# Patient Record
Sex: Male | Born: 1970 | Race: White | Hispanic: No | Marital: Single | Smoking: Never smoker
Health system: Southern US, Community
[De-identification: ages and names within clinical notes are randomized; demographics above are authoritative.]

## PROBLEM LIST (undated history)

## (undated) DIAGNOSIS — E7800 Pure hypercholesterolemia, unspecified: Secondary | ICD-10-CM

---

## 2005-07-25 ENCOUNTER — Emergency Department: Payer: Self-pay | Admitting: Emergency Medicine

## 2006-03-23 ENCOUNTER — Ambulatory Visit: Payer: Self-pay | Admitting: Internal Medicine

## 2006-04-04 ENCOUNTER — Ambulatory Visit: Payer: Self-pay | Admitting: Internal Medicine

## 2006-08-06 ENCOUNTER — Ambulatory Visit: Payer: Self-pay | Admitting: Internal Medicine

## 2006-09-04 ENCOUNTER — Ambulatory Visit: Payer: Self-pay | Admitting: Internal Medicine

## 2006-10-03 ENCOUNTER — Ambulatory Visit: Payer: Self-pay | Admitting: Internal Medicine

## 2006-10-05 ENCOUNTER — Ambulatory Visit: Payer: Self-pay | Admitting: Internal Medicine

## 2006-11-03 ENCOUNTER — Ambulatory Visit: Payer: Self-pay | Admitting: Internal Medicine

## 2015-06-28 ENCOUNTER — Encounter: Payer: Self-pay | Admitting: Emergency Medicine

## 2015-06-28 ENCOUNTER — Emergency Department
Admission: EM | Admit: 2015-06-28 | Discharge: 2015-06-28 | Disposition: A | Discharge status: Home / Self Care | Payer: Medicare Other | Attending: Emergency Medicine | Admitting: Emergency Medicine

## 2015-06-28 DIAGNOSIS — J069 Acute upper respiratory infection, unspecified: Secondary | ICD-10-CM | POA: Diagnosis not present

## 2015-06-28 DIAGNOSIS — R0981 Nasal congestion: Secondary | ICD-10-CM | POA: Diagnosis present

## 2015-06-28 MED ORDER — FLUTICASONE PROPIONATE 50 MCG/ACT NA SUSP: 1.0000 | Freq: Every day | NASAL | Status: AC

## 2015-06-28 MED ORDER — BENZONATATE 100 MG PO CAPS: 100.0000 mg | ORAL_CAPSULE | Freq: Three times a day (TID) | ORAL | Status: DC | PRN

## 2015-06-28 NOTE — Discharge Instructions (Signed)
Upper Respiratory Infection, Adult Most upper respiratory infections (URIs) are a viral infection of the air passages leading to the lungs. A URI affects the nose, throat, and upper air passages. The most common type of URI is nasopharyngitis and is typically referred to as "the common cold." URIs run their course and usually go away on their own. Most of the time, a URI does not require medical attention, but sometimes a bacterial infection in the upper airways can follow a viral infection. This is called a secondary infection. Sinus and middle ear infections are common types of secondary upper respiratory infections. Bacterial pneumonia can also complicate a URI. A URI can worsen asthma and chronic obstructive pulmonary disease (COPD). Sometimes, these complications can require emergency medical care and may be life threatening.  CAUSES Almost all URIs are caused by viruses. A virus is a type of germ and can spread from one person to another.  RISKS FACTORS You may be at risk for a URI if:   You smoke.   You have chronic heart or lung disease.  You have a weakened defense (immune) system.   You are very young or very old.   You have nasal allergies or asthma.  You work in crowded or poorly ventilated areas.  You work in health care facilities or schools. SIGNS AND SYMPTOMS  Symptoms typically develop 2-3 days after you come in contact with a cold virus. Most viral URIs last 7-10 days. However, viral URIs from the influenza virus (flu virus) can last 14-18 days and are typically more severe. Symptoms may include:   Runny or stuffy (congested) nose.   Sneezing.   Cough.   Sore throat.   Headache.   Fatigue.   Fever.   Loss of appetite.   Pain in your forehead, behind your eyes, and over your cheekbones (sinus pain).  Muscle aches.  DIAGNOSIS  Your health care provider may diagnose a URI by:  Physical exam.  Tests to check that your symptoms are not due to  another condition such as:  Strep throat.  Sinusitis.  Pneumonia.  Asthma. TREATMENT  A URI goes away on its own with time. It cannot be cured with medicines, but medicines may be prescribed or recommended to relieve symptoms. Medicines may help:  Reduce your fever.  Reduce your cough.  Relieve nasal congestion. HOME CARE INSTRUCTIONS   Take medicines only as directed by your health care provider.   Gargle warm saltwater or take cough drops to comfort your throat as directed by your health care provider.  Use a warm mist humidifier or inhale steam from a shower to increase air moisture. This may make it easier to breathe.  Drink enough fluid to keep your urine clear or pale yellow.   Eat soups and other clear broths and maintain good nutrition.   Rest as needed.   Return to work when your temperature has returned to normal or as your health care provider advises. You may need to stay home longer to avoid infecting others. You can also use a face mask and careful hand washing to prevent spread of the virus.  Increase the usage of your inhaler if you have asthma.   Do not use any tobacco products, including cigarettes, chewing tobacco, or electronic cigarettes. If you need help quitting, ask your health care provider. PREVENTION  The best way to protect yourself from getting a cold is to practice good hygiene.   Avoid oral or hand contact with people with cold  symptoms.   Wash your hands often if contact occurs.  There is no clear evidence that vitamin C, vitamin E, echinacea, or exercise reduces the chance of developing a cold. However, it is always recommended to get plenty of rest, exercise, and practice good nutrition.  SEEK MEDICAL CARE IF:   You are getting worse rather than better.   Your symptoms are not controlled by medicine.   You have chills.  You have worsening shortness of breath.  You have brown or red mucus.  You have yellow or brown nasal  discharge.  You have pain in your face, especially when you bend forward.  You have a fever.  You have swollen neck glands.  You have pain while swallowing.  You have white areas in the back of your throat. SEEK IMMEDIATE MEDICAL CARE IF:   You have severe or persistent:  Headache.  Ear pain.  Sinus pain.  Chest pain.  You have chronic lung disease and any of the following:  Wheezing.  Prolonged cough.  Coughing up blood.  A change in your usual mucus.  You have a stiff neck.  You have changes in your:  Vision.  Hearing.  Thinking.  Mood. MAKE SURE YOU:   Understand these instructions.  Will watch your condition.  Will get help right away if you are not doing well or get worse.   This information is not intended to replace advice given to you by your health care provider. Make sure you discuss any questions you have with your health care provider.   Document Released: 01/14/2001 Document Revised: 12/05/2014 Document Reviewed: 10/26/2013 Elsevier Interactive Patient Education 2016 [employer]   Take the prescription meds as directed.  Dose your daily Claritin and consider adding psuedoephedrine for sinus pressure and earache.

## 2015-06-28 NOTE — ED Notes (Signed)
C/o productive cough and congestion x 2-3 days

## 2015-06-28 NOTE — ED Notes (Signed)
None productive cough past 2-3 days. No fever. Pt reports trying Mucinex with no relief.

## 2015-06-28 NOTE — ED Provider Notes (Signed)
[facility] Emergency Department Provider Note ____________________________________________  Time seen: 57  I have reviewed the triage vital signs and the nursing notes.  HISTORY  Chief Complaint  Nasal Congestion and Cough  HPI Tommy Wood is a 44 y.o. male ports to the ED for evaluation of intermittently productive cough and congestion for the last 2-3 days. He has dosed Mucinex without relief. He denies interim fever, chills, or sweats.  History reviewed. No pertinent past medical history.  There are no active problems to display for this patient.  History reviewed. No pertinent past surgical history.  Current Outpatient Rx  Name  Route  Sig  Dispense  Refill  . benzonatate (TESSALON PERLES) 100 MG capsule   Oral   Take 1 capsule (100 mg total) by mouth 3 (three) times daily as needed for cough (Take 1-2 per dose).   30 capsule   0   . fluticasone (FLONASE) 50 MCG/ACT nasal spray   Each Nare   Place 1 spray into both nostrils daily.   16 g   0    Allergies Doxycycline  No family history on file.  Social History Social History  Substance Use Topics  . Smoking status: Never Smoker   . Smokeless tobacco: None  . Alcohol Use: No   Review of Systems  Constitutional: Negative for fever. Eyes: Negative for visual changes. ENT: Negative for sore throat. Cardiovascular: Negative for chest pain. Respiratory: Negative for shortness of breath. Gastrointestinal: Negative for abdominal pain, vomiting and diarrhea. Genitourinary: Negative for dysuria. Musculoskeletal: Negative for back pain. Skin: Negative for rash. Neurological: Negative for headaches, focal weakness or numbness. ____________________________________________  PHYSICAL EXAM:  VITAL SIGNS: ED Triage Vitals  Enc Vitals Group     BP 06/28/15 0823 99/68 mmHg     Pulse Rate 06/28/15 0819 107     Resp 06/28/15 0819 18     Temp 06/28/15 0819 97.9 F (36.6 C)     Temp  Source 06/28/15 0819 Oral     SpO2 06/28/15 0819 100 %     Weight 06/28/15 0819 80 lb (36.288 kg)     Height 06/28/15 0819  (1.499 m)     Head Cir --      Peak Flow --      Pain Score 06/28/15 0821 0     Pain Loc --      Pain Edu? --      Excl. in GC? --    Constitutional: Alert and oriented. Well appearing and in no distress. Head: Normocephalic and atraumatic.      Eyes: Conjunctivae are normal. PERRL. Normal extraocular movements      Ears: Canals clear. TMs intact bilaterally, retracted.   Nose: No congestion/rhinorrhea.   Mouth/Throat: Mucous membranes are moist.   Neck: Supple. No thyromegaly. Hematological/Lymphatic/Immunological: No cervical lymphadenopathy. Cardiovascular: Normal rate, regular rhythm.  Respiratory: Normal respiratory effort. No wheezes/rales/rhonchi. Gastrointestinal: Soft and nontender. No distention. Musculoskeletal: Nontender with normal range of motion in all extremities.  Neurologic:  Normal gait without ataxia. Normal speech and language. No gross focal neurologic deficits are appreciated. Skin:  Skin is warm, dry and intact. No rash noted. Psychiatric: Mood and affect are normal. Patient exhibits appropriate insight and judgment. ____________________________________________  INITIAL IMPRESSION / ASSESSMENT AND PLAN / ED COURSE  Symptoms consistent with an acute URI and mild bronchospasm. Patient will be discharged with prescriptions for [facility] and Flonase. Continue to dose his over-the-counter nasal spray, and is encouraged to discontinue  Mucinex at this time. He will instead dosing over-the-counter allergy medicine to reverse his rhinitis. He will follow with his primary care provider for ongoing symptoms. ____________________________________________  FINAL CLINICAL IMPRESSION(S) / ED DIAGNOSES  Final diagnoses:  Acute URI      Lissa HoardJenise V Bacon Jerzey Komperda, PA-C 06/30/15 1409  Sharman CheekPhillip Stafford, MD 06/30/15 2143

## 2018-08-07 ENCOUNTER — Encounter: Payer: Self-pay | Admitting: Emergency Medicine

## 2018-08-07 ENCOUNTER — Emergency Department
Admission: EM | Admit: 2018-08-07 | Discharge: 2018-08-07 | Disposition: A | Discharge status: Home / Self Care | Payer: Medicare Other | Attending: Emergency Medicine | Admitting: Emergency Medicine

## 2018-08-07 ENCOUNTER — Other Ambulatory Visit: Payer: Self-pay

## 2018-08-07 ENCOUNTER — Emergency Department: Payer: Medicare Other

## 2018-08-07 DIAGNOSIS — R112 Nausea with vomiting, unspecified: Secondary | ICD-10-CM | POA: Insufficient documentation

## 2018-08-07 DIAGNOSIS — J069 Acute upper respiratory infection, unspecified: Secondary | ICD-10-CM | POA: Diagnosis not present

## 2018-08-07 DIAGNOSIS — Z79899 Other long term (current) drug therapy: Secondary | ICD-10-CM | POA: Insufficient documentation

## 2018-08-07 DIAGNOSIS — R05 Cough: Secondary | ICD-10-CM | POA: Diagnosis present

## 2018-08-07 DIAGNOSIS — R1110 Vomiting, unspecified: Secondary | ICD-10-CM

## 2018-08-07 HISTORY — DX: Pure hypercholesterolemia, unspecified: E78.00

## 2018-08-07 LAB — CBC
HCT: 36.3 % — ABNORMAL LOW (ref 39.0–52.0)
HEMOGLOBIN: 12.2 g/dL — AB (ref 13.0–17.0)
MCH: 30.7 pg (ref 26.0–34.0)
MCHC: 33.6 g/dL (ref 30.0–36.0)
MCV: 91.2 fL (ref 80.0–100.0)
PLATELETS: 331 10*3/uL (ref 150–400)
RBC: 3.98 MIL/uL — AB (ref 4.22–5.81)
RDW: 13.5 % (ref 11.5–15.5)
WBC: 6.8 10*3/uL (ref 4.0–10.5)
nRBC: 0 % (ref 0.0–0.2)

## 2018-08-07 LAB — URINALYSIS, COMPLETE (UACMP) WITH MICROSCOPIC
BACTERIA UA: NONE SEEN
Bilirubin Urine: NEGATIVE
Glucose, UA: NEGATIVE mg/dL
KETONES UR: 5 mg/dL — AB
Leukocytes, UA: NEGATIVE
NITRITE: NEGATIVE
PROTEIN: NEGATIVE mg/dL
Specific Gravity, Urine: 1.024 (ref 1.005–1.030)
pH: 5 (ref 5.0–8.0)

## 2018-08-07 LAB — COMPREHENSIVE METABOLIC PANEL
ALBUMIN: 5 g/dL (ref 3.5–5.0)
ALK PHOS: 58 U/L (ref 38–126)
ALT: 22 U/L (ref 0–44)
AST: 43 U/L — ABNORMAL HIGH (ref 15–41)
Anion gap: 10 (ref 5–15)
BUN: 12 mg/dL (ref 6–20)
CALCIUM: 9.4 mg/dL (ref 8.9–10.3)
CHLORIDE: 100 mmol/L (ref 98–111)
CO2: 23 mmol/L (ref 22–32)
Creatinine, Ser: 0.61 mg/dL (ref 0.61–1.24)
GFR calc non Af Amer: >60 mL/min (ref >60)
GLUCOSE: 107 mg/dL — AB (ref 70–99)
Potassium: 3.7 mmol/L (ref 3.5–5.1)
SODIUM: 133 mmol/L — AB (ref 135–145)
TOTAL PROTEIN: 8.1 g/dL (ref 6.5–8.1)
Total Bilirubin: 1 mg/dL (ref 0.3–1.2)

## 2018-08-07 LAB — LIPASE, BLOOD: LIPASE: 26 U/L (ref 11–51)

## 2018-08-07 LAB — INFLUENZA PANEL BY PCR (TYPE A & B)
Influenza A By PCR: NEGATIVE
Influenza B By PCR: NEGATIVE

## 2018-08-07 MED ORDER — ONDANSETRON 4 MG PO TBDP: 4.0000 mg | ORAL_TABLET | Freq: Three times a day (TID) | ORAL | 0 refills | Status: AC | PRN

## 2018-08-07 MED ORDER — SODIUM CHLORIDE 0.9 % IV BOLUS: 1000.0000 mL | Freq: Once | INTRAVENOUS | Status: DC

## 2018-08-07 MED ORDER — ONDANSETRON 4 MG PO TBDP
4.0000 mg | ORAL_TABLET | Freq: Once | ORAL | Status: AC | PRN
  Administered 2018-08-07: 4 mg via ORAL
  Filled 2018-08-07: qty 1

## 2018-08-07 MED ORDER — ONDANSETRON HCL 4 MG/2ML IJ SOLN
4.0000 mg | Freq: Once | INTRAMUSCULAR | Status: DC
  Filled 2018-08-07: qty 2

## 2018-08-07 MED ORDER — ACETAMINOPHEN 500 MG PO TABS
1000.0000 mg | ORAL_TABLET | Freq: Once | ORAL | Status: AC
  Administered 2018-08-07: 1000 mg via ORAL
  Filled 2018-08-07: qty 2

## 2018-08-07 NOTE — ED Provider Notes (Signed)
[facility] Emergency Department Provider Note  Time seen: 1:10 PM  I have reviewed the triage vital signs and the nursing notes.   HISTORY  Chief Complaint Cough; Emesis; and Headache    HPI Tommy Wood is a 48 y.o. male with a past medical history of hyperlipidemia presents to the emergency department for cough congestion headache.  According to the patient family was over last week with similar symptoms, now the patient has been coughing with congestion the headache over the past 2 days.  Patient started throwing up yesterday and continued to throw up all night and today.  Denies any known fever.  No abdominal pain.  No chest pain.  States cough but no sputum production.    Past Medical History:  Diagnosis Date  . High cholesterol     There are no active problems to display for this patient.   History reviewed. No pertinent surgical history.  Prior to Admission medications   Medication Sig Start Date End Date Taking? Authorizing Provider  benzonatate (TESSALON PERLES) 100 MG capsule Take 1 capsule (100 mg total) by mouth 3 (three) times daily as needed for cough (Take 1-2 per dose). 06/28/15   Menshew, Charlesetta IvoryJenise V Bacon, PA-C  fluticasone (FLONASE) 50 MCG/ACT nasal spray Place 1 spray into both nostrils daily. 06/28/15   Menshew, Charlesetta IvoryJenise V Bacon, PA-C    Allergies  Allergen Reactions  . Doxycycline Nausea And Vomiting    No family history on file.  Social History Social History   Tobacco Use  . Smoking status: Never Smoker  Substance Use Topics  . Alcohol use: No  . Drug use: Not on file    Review of Systems Constitutional: Negative for fever. ENT: Positive for congestion Cardiovascular: Negative for chest pain. Respiratory: Negative for shortness of breath. Gastrointestinal: Negative for abdominal pain.  Positive for nausea vomiting.  Negative for diarrhea Genitourinary: Negative for urinary compaints Musculoskeletal: Negative for  musculoskeletal complaints Skin: Negative for skin complaints  Neurological: Moderate headache All other ROS negative  ____________________________________________   PHYSICAL EXAM:  VITAL SIGNS: ED Triage Vitals  Enc Vitals Group     BP 08/07/18 1038 99/67     Pulse Rate 08/07/18 1038 85     Resp 08/07/18 1038 18     Temp 08/07/18 1038 97.6 F (36.4 C)     Temp Source 08/07/18 1038 Oral     SpO2 08/07/18 1038 97 %     Weight 08/07/18 1040 90 lb (40.8 kg)     Height 08/07/18 1040 5\' 2"  (1.575 m)     Head Circumference --      Peak Flow --      Pain Score 08/07/18 1039 6     Pain Loc --      Pain Edu? --      Excl. in GC? --    Constitutional: Alert and oriented. Well appearing and in no distress. Eyes: Normal exam ENT   Head: Normocephalic and atraumatic   Mouth/Throat: Mucous membranes are moist. Cardiovascular: Normal rate, regular rhythm Respiratory: Normal respiratory effort without tachypnea nor retractions. Breath sounds are clear Gastrointestinal: Soft and nontender. No distention.   Musculoskeletal: Nontender with normal range of motion in all extremities. No lower extremity tenderness or edema. Neurologic:  Normal speech and language. No gross focal neurologic deficits  Skin:  Skin is warm, dry and intact.  Psychiatric: Mood and affect are normal.      RADIOLOGY  Chest x-ray negative  ____________________________________________  INITIAL IMPRESSION / ASSESSMENT AND PLAN / ED COURSE  Pertinent labs & imaging results that were available during my care of the patient were reviewed by me and considered in my medical decision making (see chart for details).  Patient presents to the emergency department for cough and congestion as well as headache.  Patient states nausea vomiting since yesterday and all night.  We will check labs, influenza, chest x-ray and continue to closely monitor.  Overall the patient appears very well, no distress.  Chest x-ray  is negative.  Labs are largely within normal limits.  Influenza is negative.  Patient states he is feeling much better.  Headache is nearly gone.  Patient receiving IV fluids, and Zofran.  Will discharge from the emergency department with PCP follow-up.  We will discharge with nausea medication to be used if needed.  Continue supportive care at home.  Patient agreeable to plan of care.  ____________________________________________   FINAL CLINICAL IMPRESSION(S) / ED DIAGNOSES  Nausea vomiting Cough congestion   Minna Antis, MD 08/07/18 1440

## 2018-08-07 NOTE — ED Triage Notes (Signed)
PT arrived with concerns over headache, dry cough, and emesis that started 'a couple of days ago." Pt reports "I threw up all night long."

## 2020-10-25 IMAGING — CR DG CHEST 2V
1 series · 2 of 2 positions shown · non-contrast
Comparison: 11/24/01

CLINICAL DATA: Cough.

EXAM:
CHEST - 2 VIEW

[Series 1: dg chest 2 view · 0.14mm/px · 2 of 2 slices shown]
[im 1/2]
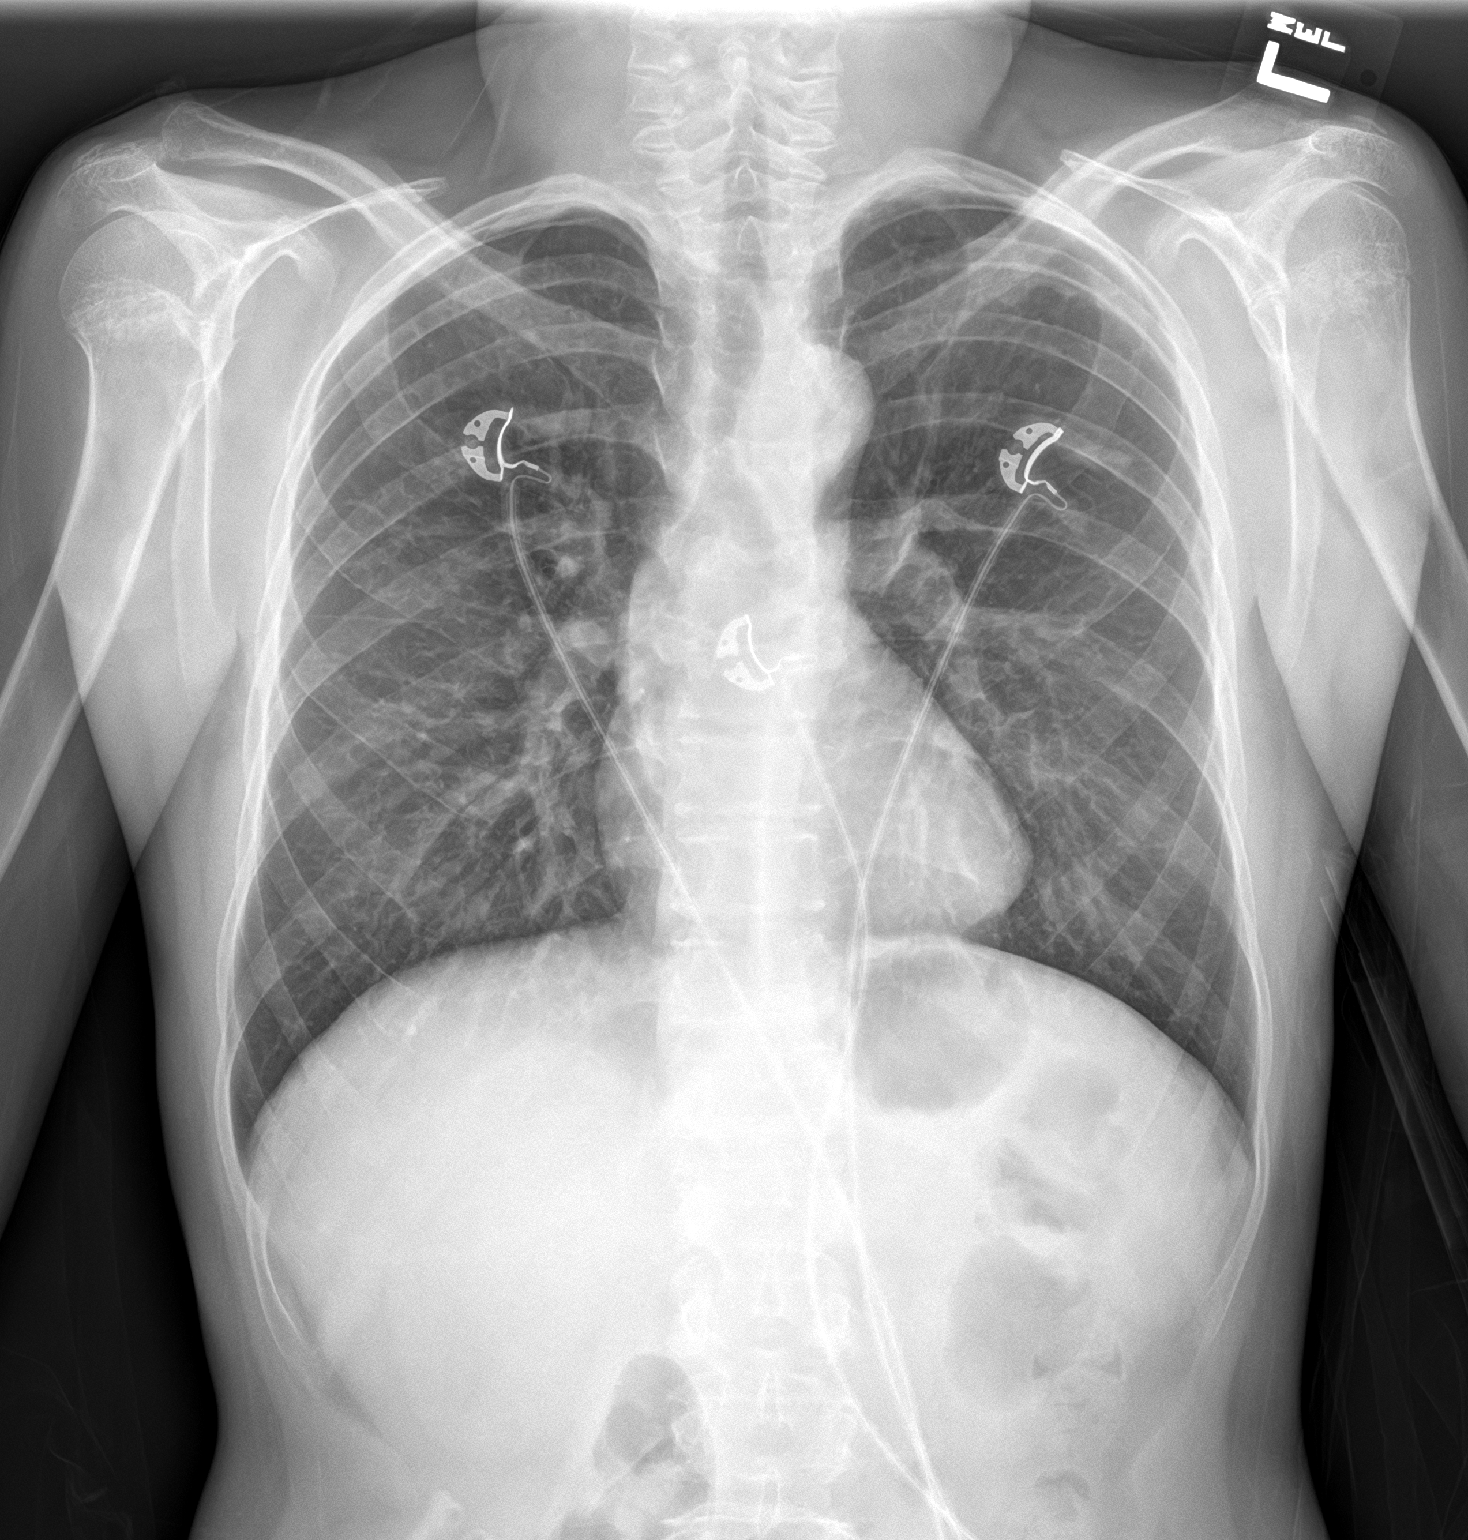
[im 2/2]
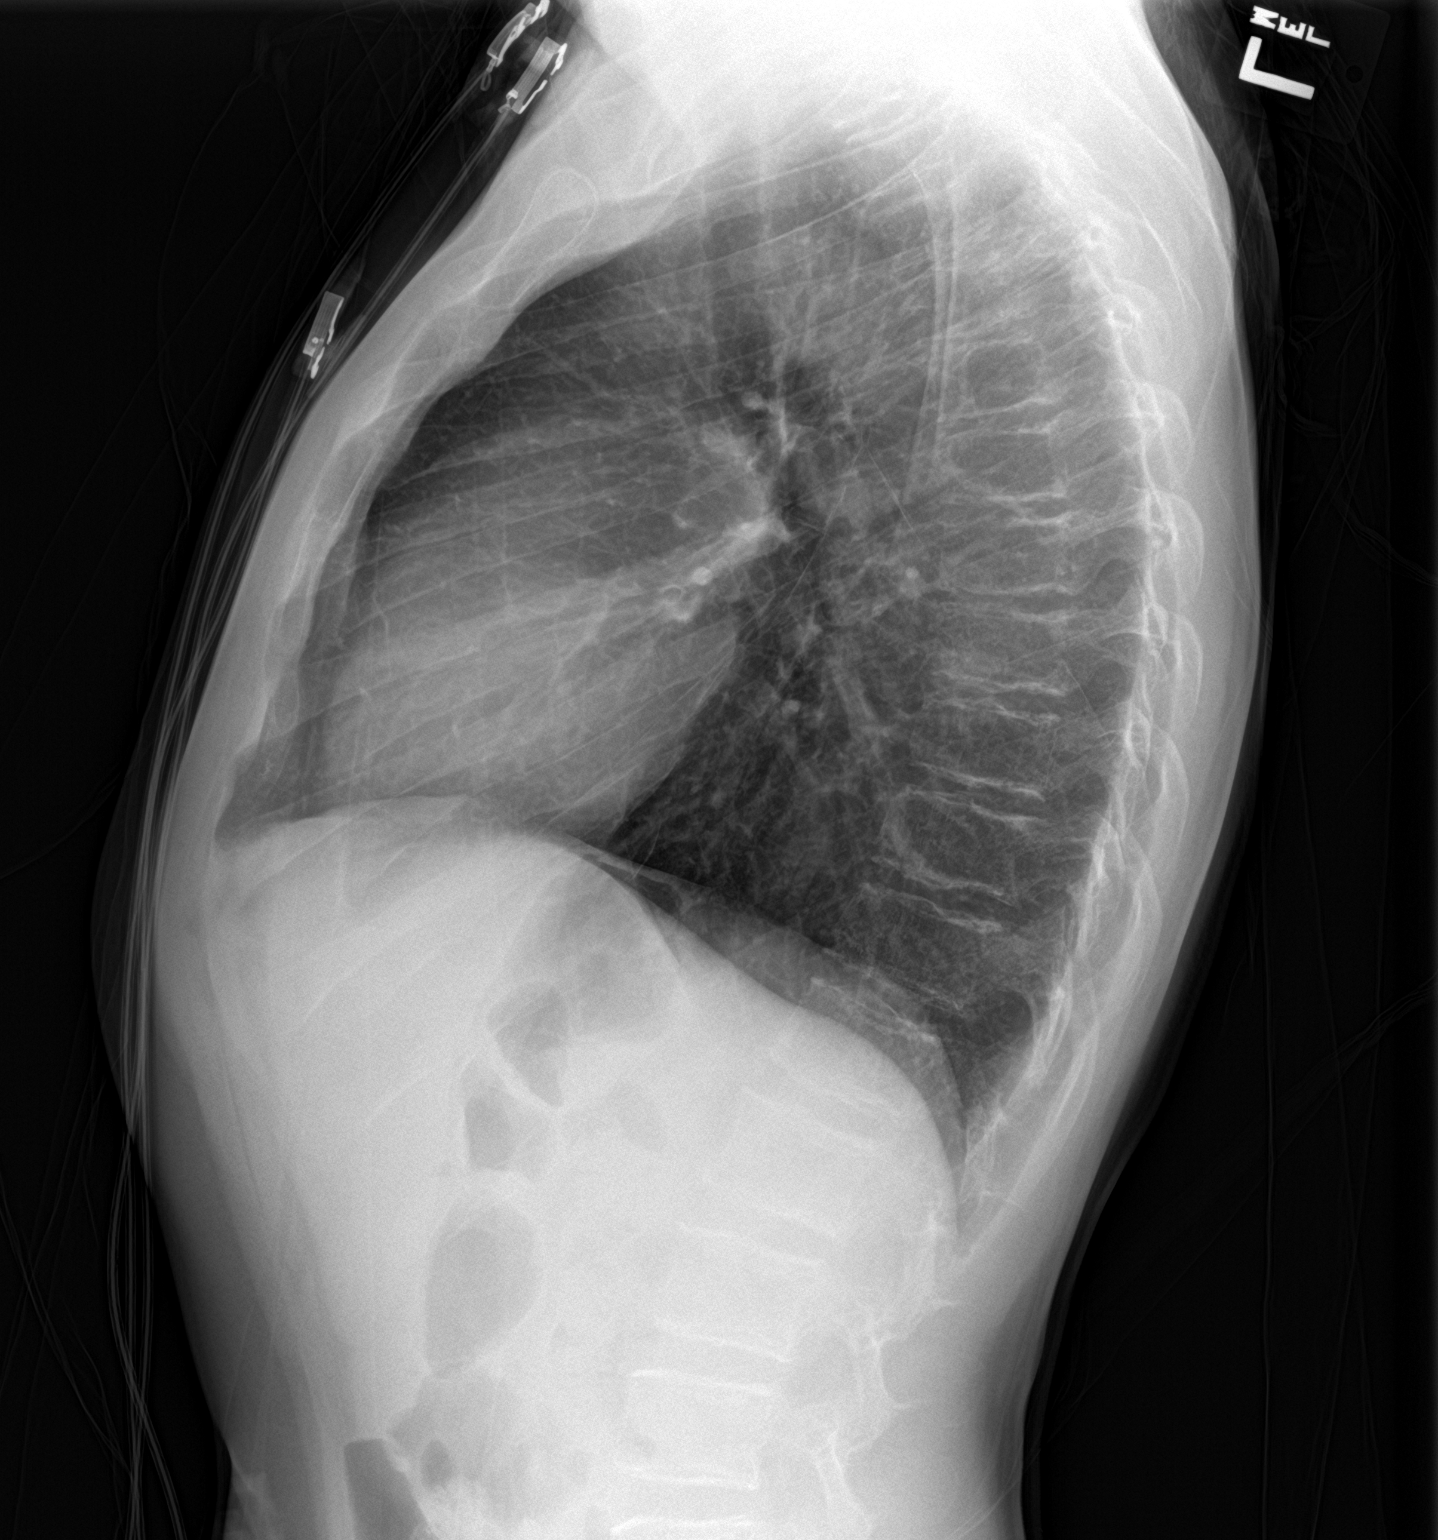

[2 of 2 positions shown; findings below may reference images not displayed]

FINDINGS: The heart size and mediastinal contours are within normal limits.
Both lungs are clear. The visualized skeletal structures are
unremarkable.
IMPRESSION: No active cardiopulmonary disease.

## 2022-03-24 ENCOUNTER — Other Ambulatory Visit: Payer: Self-pay

## 2022-03-24 ENCOUNTER — Encounter: Payer: Self-pay | Admitting: Emergency Medicine

## 2022-03-24 ENCOUNTER — Ambulatory Visit
Admission: EM | Admit: 2022-03-24 | Discharge: 2022-03-24 | Disposition: A | Discharge status: Home / Self Care | Payer: Medicare Other

## 2022-03-24 DIAGNOSIS — U071 COVID-19: Secondary | ICD-10-CM | POA: Diagnosis not present

## 2022-03-24 MED ORDER — PROMETHAZINE-DM 6.25-15 MG/5ML PO SYRP: 5.0000 mL | ORAL_SOLUTION | Freq: Four times a day (QID) | ORAL | 0 refills | Status: DC | PRN

## 2022-03-24 MED ORDER — IPRATROPIUM BROMIDE 0.06 % NA SOLN
2.0000 | Freq: Four times a day (QID) | NASAL | 12 refills | Status: AC
Start: 2022-03-24 — End: ?

## 2022-03-24 MED ORDER — BENZONATATE 100 MG PO CAPS: 200.0000 mg | ORAL_CAPSULE | Freq: Three times a day (TID) | ORAL | 0 refills | Status: AC

## 2022-03-24 NOTE — ED Provider Notes (Signed)
[facility]    CSN: [id number] Arrival date & time: 03/24/22  0841      History   Chief Complaint Chief Complaint  Patient presents with   Cough    HPI Tommy Wood is a 51 y.o. male.   HPI  51 year old male here for evaluation of respiratory complaints.  Patient reports that symptoms began 7 days ago and they consist of fatigue, postnasal drip, nasal congestion, nonproductive cough, and an upset stomach.  He denies any fever, ear pain, sore throat, vomiting, diarrhea, shortness of breath, or wheezing.  He has taken a home COVID test which was positive.  Past Medical History:  Diagnosis Date   High cholesterol     There are no problems to display for this patient.   History reviewed. No pertinent surgical history.     Home Medications    Prior to Admission medications   Medication Sig Start Date End Date Taking? Authorizing Provider  benzonatate (TESSALON) 100 MG capsule Take 2 capsules (200 mg total) by mouth every 8 (eight) hours. 03/24/22  Yes Becky Augusta, NP  ipratropium (ATROVENT) 0.06 % nasal spray Place 2 sprays into both nostrils 4 (four) times daily. 03/24/22  Yes Becky Augusta, NP  promethazine-dextromethorphan (PROMETHAZINE-DM) 6.25-15 MG/5ML syrup Take 5 mLs by mouth 4 (four) times daily as needed. 03/24/22  Yes Becky Augusta, NP  fenofibrate (TRICOR) 145 MG tablet Take 145 mg by mouth daily. 01/06/22   [provider]  fluticasone (FLONASE) 50 MCG/ACT nasal spray Place 1 spray into both nostrils daily. Patient not taking: Reported on 03/24/2022 06/28/15   Menshew, Charlesetta Ivory, PA-C  ondansetron (ZOFRAN ODT) 4 MG disintegrating tablet Take 1 tablet (4 mg total) by mouth every 8 (eight) hours as needed for nausea or vomiting. Patient not taking: Reported on 03/24/2022 08/07/18   Minna Antis, MD    Family History History reviewed. No pertinent family history.  Social History Social History   Tobacco Use   Smoking status: Never   Vaping Use   Vaping Use: Never used  Substance Use Topics   Alcohol use: No   Drug use: Never     Allergies   Doxycycline   Review of Systems Review of Systems  Constitutional:  Positive for fatigue. Negative for fever.  HENT:  Positive for congestion and postnasal drip. Negative for ear pain, rhinorrhea and sore throat.   Respiratory:  Positive for cough. Negative for shortness of breath and wheezing.   Gastrointestinal:  Positive for nausea. Negative for diarrhea and vomiting.  Skin:  Negative for rash.  Hematological: Negative.   Psychiatric/Behavioral: Negative.       Physical Exam Triage Vital Signs ED Triage Vitals [03/24/22 0903]  Enc Vitals Group     BP      Pulse      Resp      Temp      Temp src      SpO2      Weight      Height      Head Circumference      Peak Flow      Pain Score 4     Pain Loc      Pain Edu?      Excl. in GC?    No data found.  Updated Vital Signs BP 122/80 (BP Location: Left Arm) Comment (BP Location): small cuff  Pulse 68   Temp 97.6 F (36.4 C) (Oral)   Resp 18   SpO2 100%  Visual Acuity Right Eye Distance:   Left Eye Distance:   Bilateral Distance:    Right Eye Near:   Left Eye Near:    Bilateral Near:     Physical Exam Vitals and nursing note reviewed.  Constitutional:      Appearance: Normal appearance. He is not ill-appearing.  HENT:     Head: Normocephalic and atraumatic.     Right Ear: Tympanic membrane, ear canal and external ear normal. There is no impacted cerumen.     Left Ear: Tympanic membrane, ear canal and external ear normal. There is no impacted cerumen.     Nose: Congestion and rhinorrhea present.     Mouth/Throat:     Mouth: Mucous membranes are moist.     Pharynx: Oropharynx is clear. No oropharyngeal exudate or posterior oropharyngeal erythema.  Cardiovascular:     Rate and Rhythm: Normal rate and regular rhythm.     Pulses: Normal pulses.     Heart sounds: Normal heart sounds. No  murmur heard.    No friction rub. No gallop.  Pulmonary:     Effort: Pulmonary effort is normal.     Breath sounds: Normal breath sounds. No wheezing, rhonchi or rales.  Musculoskeletal:     Cervical back: Normal range of motion and neck supple.  Lymphadenopathy:     Cervical: No cervical adenopathy.  Skin:    General: Skin is warm and dry.     Capillary Refill: Capillary refill takes less than 2 seconds.     Findings: No erythema or rash.  Neurological:     General: No focal deficit present.     Mental Status: He is alert and oriented to person, place, and time.  Psychiatric:        Mood and Affect: Mood normal.        Behavior: Behavior normal.        Thought Content: Thought content normal.        Judgment: Judgment normal.      UC Treatments / Results  Labs (all labs ordered are listed, but only abnormal results are displayed) Labs Reviewed - No data to display  EKG   Radiology No results found.  Procedures Procedures (including critical care time)  Medications Ordered in UC Medications - No data to display  Initial Impression / Assessment and Plan / UC Course  I have reviewed the triage vital signs and the nursing notes.  Pertinent labs & imaging results that were available during my care of the patient were reviewed by me and considered in my medical decision making (see chart for details).   Patient is a very pleasant, nontoxic-appearing 51 year old male here for evaluation of respiratory complaints as outlined in HPI above.  Patient has taken a home COVID test and he is positive.  He has had symptoms for the last 7 days and he is in no respiratory distress.  He is able to speak in full sentences without any dyspnea or tachypnea.  Physical exam reveals pearly-gray tympanic membranes bilaterally with normal light reflex and clear external auditory canals.  Nasal mucosa is mildly edematous and erythematous with clear discharge in both nares.  Oropharyngeal exam  reveals clear postnasal drip but no erythema or injection of the posterior oropharynx.  No cervical lymphadenopathy appreciable exam.  Cardiopulmonary exam reveals S1-S2 heart sounds with regular rate and rhythm and lung sounds are clear auscultation all fields.  Skin is free of rashes.  I will treat patient for COVID-19 but he is  outside the window for isolation or antiviral therapy.  Only significant risk factor is high cholesterol.  I have cautioned him that if he develops worsening respiratory symptoms that he needs to go to the ER.  I will treat his symptoms with Atrovent nasal spray, Tessalon Perles, and Promethazine DM cough syrup.   Final Clinical Impressions(s) / UC Diagnoses   Final diagnoses:  COVID-19     Discharge Instructions      Your home test for COVID was positive which indicated that you have COVID.  You are beyond the infectious period and need for quarantine as you have had symptoms for 7 days. You do need to wear a mask around others for an additional 3 days.  For your respiratory symptoms please use the following:  Use the Atrovent nasal spray, 2 squirts in each nostril every 6 hours, as needed for runny nose and postnasal drip.  Use the Tessalon Perles every 8 hours during the day.  Take them with a small sip of water.  They may give you some numbness to the base of your tongue or a metallic taste in your mouth, this is normal.  Use the Promethazine DM cough syrup at bedtime for cough and congestion.  It will make you drowsy so do not take it during the day.  Return for reevaluation or see your primary care provider for any new or worsening symptoms. If you develop shortness of breath, especially at rest, cannont catch your breath, or cannot speak in full sentences you need to go to the ER.     ED Prescriptions     Medication Sig Dispense Auth. Provider   benzonatate (TESSALON) 100 MG capsule Take 2 capsules (200 mg total) by mouth every 8 (eight) hours. 21  capsule Becky Augusta, NP   ipratropium (ATROVENT) 0.06 % nasal spray Place 2 sprays into both nostrils 4 (four) times daily. 15 mL Becky Augusta, NP   promethazine-dextromethorphan (PROMETHAZINE-DM) 6.25-15 MG/5ML syrup Take 5 mLs by mouth 4 (four) times daily as needed. 118 mL Becky Augusta, NP      PDMP not reviewed this encounter.   Becky Augusta, NP 03/24/22 [phone]

## 2022-03-24 NOTE — ED Triage Notes (Signed)
Fatigue for 7 days.  Cough, sinus drainage, and stomach upset.  No vomiting or diarrhea.  Covid test was positive yesterday  8/14-symptoms onset 8/20- tested positive for covid at home

## 2022-03-24 NOTE — Discharge Instructions (Signed)
Your home test for COVID was positive which indicated that you have COVID.  You are beyond the infectious period and need for quarantine as you have had symptoms for 7 days. You do need to wear a mask around others for an additional 3 days.  For your respiratory symptoms please use the following:  Use the Atrovent nasal spray, 2 squirts in each nostril every 6 hours, as needed for runny nose and postnasal drip.  Use the Tessalon Perles every 8 hours during the day.  Take them with a small sip of water.  They may give you some numbness to the base of your tongue or a metallic taste in your mouth, this is normal.  Use the Promethazine DM cough syrup at bedtime for cough and congestion.  It will make you drowsy so do not take it during the day.  Return for reevaluation or see your primary care provider for any new or worsening symptoms. If you develop shortness of breath, especially at rest, cannont catch your breath, or cannot speak in full sentences you need to go to the ER.

## 2022-07-19 ENCOUNTER — Ambulatory Visit
Admission: EM | Admit: 2022-07-19 | Discharge: 2022-07-19 | Disposition: A | Discharge status: Home / Self Care | Payer: 59 | Attending: Family Medicine | Admitting: Family Medicine

## 2022-07-19 DIAGNOSIS — Z1152 Encounter for screening for COVID-19: Secondary | ICD-10-CM | POA: Diagnosis not present

## 2022-07-19 DIAGNOSIS — J020 Streptococcal pharyngitis: Secondary | ICD-10-CM | POA: Insufficient documentation

## 2022-07-19 DIAGNOSIS — J101 Influenza due to other identified influenza virus with other respiratory manifestations: Secondary | ICD-10-CM | POA: Diagnosis not present

## 2022-07-19 LAB — RESP PANEL BY RT-PCR (RSV, FLU A&B, COVID)  RVPGX2
Influenza A by PCR: POSITIVE — AB
Influenza B by PCR: NEGATIVE
Resp Syncytial Virus by PCR: NEGATIVE
SARS Coronavirus 2 by RT PCR: NEGATIVE

## 2022-07-19 LAB — GROUP A STREP BY PCR: Group A Strep by PCR: DETECTED — AB

## 2022-07-19 MED ORDER — ONDANSETRON HCL 4 MG PO TABS: 4.0000 mg | ORAL_TABLET | Freq: Four times a day (QID) | ORAL | 0 refills | Status: AC

## 2022-07-19 MED ORDER — PROMETHAZINE-DM 6.25-15 MG/5ML PO SYRP: 5.0000 mL | ORAL_SOLUTION | Freq: Four times a day (QID) | ORAL | 0 refills | Status: AC | PRN

## 2022-07-19 MED ORDER — AMOXICILLIN 500 MG PO TABS: 1000.0000 mg | ORAL_TABLET | Freq: Every day | ORAL | 0 refills | Status: AC

## 2022-07-19 MED ORDER — OSELTAMIVIR PHOSPHATE 30 MG PO CAPS: 60.0000 mg | ORAL_CAPSULE | Freq: Two times a day (BID) | ORAL | 0 refills | Status: AC

## 2022-07-19 MED ORDER — IBUPROFEN 100 MG/5ML PO SUSP
400.0000 mg | Freq: Once | ORAL | Status: AC
  Administered 2022-07-19: 400 mg via ORAL

## 2022-07-19 NOTE — ED Triage Notes (Addendum)
Pt c/o sinus pain & headache, nausea onset x couple days ago, mother states pt has been exposed to flu & strep from grandkids

## 2022-07-19 NOTE — Discharge Instructions (Addendum)
Stop by the pharmacy to pick up your prescription for strep throat. If for some reason you COVID, influenza or RSV test is positive, I will call you.

## 2022-07-19 NOTE — ED Provider Notes (Incomplete)
[facility]    CSN: [id number] Arrival date & time: 07/19/22  1031      History   Chief Complaint Chief Complaint  Patient presents with  . Facial Pain  . Nausea    HPI Tommy Wood is a 51 y.o. male.   HPI   Tommy Wood presents for fever, cough, nasal congestion, vomiting and ear pain that started 1-2 days ago.  He was exposed to strep and influenza by the kids in his family.  No Motrin or Tylenol prior to arrival,    Fever : no  Chills: no Sore throat: no   Cough: no Sputum: no Nasal congestion : no  Rhinorrhea: no Myalgias: no Appetite: normal  Hydration: normal  Abdominal pain: no Nausea: no Vomiting: no Diarrhea: No Rash: No Sleep disturbance: no Headache: no      Past Medical History:  Diagnosis Date  . High cholesterol     There are no problems to display for this patient.   History reviewed. No pertinent surgical history.     Home Medications    Prior to Admission medications   Medication Sig Start Date End Date Taking? Authorizing Provider  IRON, FERROUS SULFATE, PO Take by mouth.   Yes [provider]  benzonatate (TESSALON) 100 MG capsule Take 2 capsules (200 mg total) by mouth every 8 (eight) hours. 03/24/22   Becky Augusta, NP  fenofibrate (TRICOR) 145 MG tablet Take 145 mg by mouth daily. 01/06/22   [provider]  fluticasone (FLONASE) 50 MCG/ACT nasal spray Place 1 spray into both nostrils daily. Patient not taking: Reported on 03/24/2022 06/28/15   Menshew, Charlesetta Ivory, PA-C  ipratropium (ATROVENT) 0.06 % nasal spray Place 2 sprays into both nostrils 4 (four) times daily. 03/24/22   Becky Augusta, NP  ondansetron (ZOFRAN ODT) 4 MG disintegrating tablet Take 1 tablet (4 mg total) by mouth every 8 (eight) hours as needed for nausea or vomiting. Patient not taking: Reported on 03/24/2022 08/07/18   Minna Antis, MD  promethazine-dextromethorphan (PROMETHAZINE-DM) 6.25-15 MG/5ML syrup Take 5 mLs by mouth 4  (four) times daily as needed. 03/24/22   Becky Augusta, NP    Family History History reviewed. No pertinent family history.  Social History Social History   Tobacco Use  . Smoking status: Never  Vaping Use  . Vaping Use: Never used  Substance Use Topics  . Alcohol use: No  . Drug use: Never     Allergies   Carbinoxamine and Doxycycline   Review of Systems Review of Systems: negative unless otherwise stated in HPI.      Physical Exam Triage Vital Signs ED Triage Vitals  Enc Vitals Group     BP 07/19/22 1250 97/67     Pulse Rate 07/19/22 1250 (!) 101     Resp --      Temp 07/19/22 1250 (!) 101.4 F (38.6 C)     Temp Source 07/19/22 1250 Oral     SpO2 07/19/22 1250 99 %     Weight 07/19/22 1248 85 lb (38.6 kg)     Height 07/19/22 1248 5\' 3"  (1.6 m)     Head Circumference --      Peak Flow --      Pain Score 07/19/22 1248 0     Pain Loc --      Pain Edu? --      Excl. in GC? --    No data found.  Updated Vital Signs BP 97/67 (BP Location:  Left Arm)   Pulse (!) 101   Temp (!) 101.4 F (38.6 C) (Oral)   Ht 5\' 3"  (1.6 m)   Wt 36.7 kg   SpO2 99%   BMI 14.35 kg/m   Visual Acuity Right Eye Distance:   Left Eye Distance:   Bilateral Distance:    Right Eye Near:   Left Eye Near:    Bilateral Near:     Physical Exam GEN:     alert, non-toxic appearing male in no distress ***   HENT:  mucus membranes moist, oropharyngeal ***without lesions or ***exudate, no*** tonsillar hypertrophy, *** mild oropharyngeal erythema , *** moderate erythematous edematous turbinates, ***clear nasal discharge, ***bilateral TM normal EYES:   pupils equal and reactive, ***no scleral injection or discharge NECK:  normal ROM, no ***lymphadenopathy, ***no meningismus   RESP:  no increased work of breathing, ***clear to auscultation bilaterally CVS:   regular rate ***and rhythm Skin:   warm and dry, no rash on visible skin***    UC Treatments / Results  Labs (all labs ordered  are listed, but only abnormal results are displayed) Labs Reviewed  GROUP A STREP BY PCR  RESP PANEL BY RT-PCR (RSV, FLU A&B, COVID)  [id number]    EKG   Radiology No results found.  Procedures Procedures (including critical care time)  Medications Ordered in UC Medications  ibuprofen (ADVIL) 100 MG/5ML suspension 400 mg (400 mg Oral Given 07/19/22 1300)    Initial Impression / Assessment and Plan / UC Course  I have reviewed the triage vital signs and the nursing notes.  Pertinent labs & imaging results that were available during my care of the patient were reviewed by me and considered in my medical decision making (see chart for details).       Pt is a 51 y.o. male who presents for *** days of respiratory symptoms. Kamarius is ***afebrile here without recent antipyretics. Satting well on room air. Overall pt is ***non-toxic appearing, well hydrated, without respiratory distress. Pulmonary exam ***is unremarkable.  COVID and influenza testing obtained ***and was negative. ***Pt to quarantine until COVID test results or longer if positive.  I will call patient with test results, if positive. History consistent with ***viral respiratory illness. Discussed symptomatic treatment.  Explained lack of efficacy of antibiotics in viral disease.  Typical duration of symptoms discussed.   Return and ED precautions given and voiced understanding. Discussed MDM, treatment plan and plan for follow-up with patient/guardian*** who agrees with plan.     Final Clinical Impressions(s) / UC Diagnoses   Final diagnoses:  None   Discharge Instructions   None    ED Prescriptions   None    PDMP not reviewed this encounter.
